# Patient Record
Sex: Male | Born: 1960 | Race: White | Hispanic: No | Marital: Married | Smoking: Never smoker
Health system: Southern US, Community
[De-identification: ages and names within clinical notes are randomized; demographics above are authoritative.]

## PROBLEM LIST (undated history)

## (undated) DIAGNOSIS — F32A Depression, unspecified: Secondary | ICD-10-CM

## (undated) DIAGNOSIS — I4891 Unspecified atrial fibrillation: Secondary | ICD-10-CM

## (undated) DIAGNOSIS — N2889 Other specified disorders of kidney and ureter: Secondary | ICD-10-CM

## (undated) DIAGNOSIS — R569 Unspecified convulsions: Secondary | ICD-10-CM

## (undated) DIAGNOSIS — F329 Major depressive disorder, single episode, unspecified: Secondary | ICD-10-CM

## (undated) DIAGNOSIS — G4730 Sleep apnea, unspecified: Secondary | ICD-10-CM

## (undated) DIAGNOSIS — F419 Anxiety disorder, unspecified: Secondary | ICD-10-CM

## (undated) DIAGNOSIS — Z87898 Personal history of other specified conditions: Secondary | ICD-10-CM

## (undated) DIAGNOSIS — I10 Essential (primary) hypertension: Secondary | ICD-10-CM

## (undated) DIAGNOSIS — C787 Secondary malignant neoplasm of liver and intrahepatic bile duct: Secondary | ICD-10-CM

## (undated) DIAGNOSIS — C801 Malignant (primary) neoplasm, unspecified: Secondary | ICD-10-CM

## (undated) HISTORY — DX: Unspecified atrial fibrillation: I48.91

## (undated) HISTORY — DX: Unspecified convulsions: R56.9

## (undated) HISTORY — PX: BRAIN SURGERY: SHX531

## (undated) HISTORY — DX: Secondary malignant neoplasm of liver and intrahepatic bile duct: C78.7

## (undated) HISTORY — DX: Anxiety disorder, unspecified: F41.9

## (undated) HISTORY — DX: Depression, unspecified: F32.A

## (undated) HISTORY — PX: APPENDECTOMY: SHX54

## (undated) HISTORY — PX: LIVER SURGERY: SHX698

## (undated) HISTORY — DX: Other specified disorders of kidney and ureter: N28.89

## (undated) HISTORY — PX: ABLATION: SHX5711

## (undated) HISTORY — DX: Personal history of other specified conditions: Z87.898

## (undated) HISTORY — DX: Major depressive disorder, single episode, unspecified: F32.9

## (undated) HISTORY — DX: Sleep apnea, unspecified: G47.30

---

## 2009-04-01 ENCOUNTER — Ambulatory Visit (HOSPITAL_COMMUNITY): Admission: RE | Admit: 2009-04-01 | Discharge: 2009-04-02 | Payer: Self-pay | Admitting: Neurosurgery

## 2010-08-16 LAB — PROTIME-INR: INR: 0.99 (ref 0.00–1.49)

## 2010-08-16 LAB — DIFFERENTIAL
Lymphs Abs: 1.8 10*3/uL (ref 0.7–4.0)
Monocytes Relative: 7 % (ref 3–12)
Neutro Abs: 4.3 10*3/uL (ref 1.7–7.7)
Neutrophils Relative %: 63 % (ref 43–77)

## 2010-08-16 LAB — COMPREHENSIVE METABOLIC PANEL
ALT: 29 U/L (ref 0–53)
BUN: 11 mg/dL (ref 6–23)
Calcium: 9.4 mg/dL (ref 8.4–10.5)
Glucose, Bld: 85 mg/dL (ref 70–99)
Sodium: 136 mEq/L (ref 135–145)
Total Protein: 7 g/dL (ref 6.0–8.3)

## 2010-08-16 LAB — CBC
Hemoglobin: 14.1 g/dL (ref 13.0–17.0)
MCHC: 34.2 g/dL (ref 30.0–36.0)
Platelets: 227 10*3/uL (ref 150–400)
RDW: 12.6 % (ref 11.5–15.5)

## 2010-08-16 LAB — APTT: aPTT: 38 seconds — ABNORMAL HIGH (ref 24–37)

## 2010-08-16 LAB — URINALYSIS, ROUTINE W REFLEX MICROSCOPIC
Nitrite: NEGATIVE
Specific Gravity, Urine: 1.019 (ref 1.005–1.030)
Urobilinogen, UA: 0.2 mg/dL (ref 0.0–1.0)

## 2013-05-28 ENCOUNTER — Ambulatory Visit: Payer: Self-pay | Admitting: Unknown Physician Specialty

## 2013-06-02 LAB — PATHOLOGY REPORT

## 2016-02-13 ENCOUNTER — Emergency Department: Payer: Managed Care, Other (non HMO)

## 2016-02-13 ENCOUNTER — Encounter: Payer: Self-pay | Admitting: *Deleted

## 2016-02-13 ENCOUNTER — Emergency Department
Admission: EM | Admit: 2016-02-13 | Discharge: 2016-02-13 | Disposition: A | Discharge status: Home / Self Care | Payer: Managed Care, Other (non HMO) | Attending: Emergency Medicine | Admitting: Emergency Medicine

## 2016-02-13 DIAGNOSIS — Z79899 Other long term (current) drug therapy: Secondary | ICD-10-CM | POA: Insufficient documentation

## 2016-02-13 DIAGNOSIS — I10 Essential (primary) hypertension: Secondary | ICD-10-CM | POA: Insufficient documentation

## 2016-02-13 DIAGNOSIS — C787 Secondary malignant neoplasm of liver and intrahepatic bile duct: Secondary | ICD-10-CM | POA: Insufficient documentation

## 2016-02-13 DIAGNOSIS — R1011 Right upper quadrant pain: Secondary | ICD-10-CM | POA: Diagnosis present

## 2016-02-13 DIAGNOSIS — Z7982 Long term (current) use of aspirin: Secondary | ICD-10-CM | POA: Insufficient documentation

## 2016-02-13 HISTORY — DX: Essential (primary) hypertension: I10

## 2016-02-13 HISTORY — DX: Malignant (primary) neoplasm, unspecified: C80.1

## 2016-02-13 LAB — TROPONIN I: TROPONIN I: 0.03 ng/mL — AB (ref <0.03)

## 2016-02-13 LAB — BASIC METABOLIC PANEL
Anion gap: 8 (ref 5–15)
BUN: 17 mg/dL (ref 6–20)
CALCIUM: 9.1 mg/dL (ref 8.9–10.3)
CO2: 30 mmol/L (ref 22–32)
CREATININE: 1.29 mg/dL — AB (ref 0.61–1.24)
Chloride: 97 mmol/L — ABNORMAL LOW (ref 101–111)
GFR calc Af Amer: >60 mL/min (ref >60)
GFR, EST NON AFRICAN AMERICAN: 60 mL/min — AB (ref >60)
GLUCOSE: 120 mg/dL — AB (ref 65–99)
Potassium: 4.8 mmol/L (ref 3.5–5.1)
Sodium: 135 mmol/L (ref 135–145)

## 2016-02-13 LAB — HEPATIC FUNCTION PANEL
ALT: 21 U/L (ref 17–63)
AST: 36 U/L (ref 15–41)
Albumin: 3.5 g/dL (ref 3.5–5.0)
Alkaline Phosphatase: 128 U/L — ABNORMAL HIGH (ref 38–126)
BILIRUBIN DIRECT: 0.1 mg/dL (ref 0.1–0.5)
BILIRUBIN INDIRECT: 0.5 mg/dL (ref 0.3–0.9)
Total Bilirubin: 0.6 mg/dL (ref 0.3–1.2)
Total Protein: 8.1 g/dL (ref 6.5–8.1)

## 2016-02-13 LAB — CBC
HCT: 40.9 % (ref 40.0–52.0)
Hemoglobin: 13.4 g/dL (ref 13.0–18.0)
MCH: 27.8 pg (ref 26.0–34.0)
MCHC: 32.8 g/dL (ref 32.0–36.0)
MCV: 84.5 fL (ref 80.0–100.0)
PLATELETS: 346 10*3/uL (ref 150–440)
RBC: 4.84 MIL/uL (ref 4.40–5.90)
RDW: 13.5 % (ref 11.5–14.5)
WBC: 16.9 10*3/uL — ABNORMAL HIGH (ref 3.8–10.6)

## 2016-02-13 LAB — LIPASE, BLOOD: LIPASE: 15 U/L (ref 11–51)

## 2016-02-13 MED ORDER — IOPAMIDOL (ISOVUE-300) INJECTION 61%
100.0000 mL | Freq: Once | INTRAVENOUS | Status: AC | PRN
  Administered 2016-02-13: 100 mL via INTRAVENOUS

## 2016-02-13 MED ORDER — SODIUM CHLORIDE 0.9 % IV BOLUS (SEPSIS)
1000.0000 mL | Freq: Once | INTRAVENOUS | Status: AC
  Administered 2016-02-13: 1000 mL via INTRAVENOUS

## 2016-02-13 MED ORDER — MORPHINE SULFATE (PF) 4 MG/ML IV SOLN
4.0000 mg | Freq: Once | INTRAVENOUS | Status: AC
  Administered 2016-02-13: 4 mg via INTRAVENOUS
  Filled 2016-02-13: qty 1

## 2016-02-13 MED ORDER — ONDANSETRON HCL 4 MG/2ML IJ SOLN
4.0000 mg | Freq: Once | INTRAMUSCULAR | Status: AC
  Administered 2016-02-13: 4 mg via INTRAVENOUS
  Filled 2016-02-13: qty 2

## 2016-02-13 MED ORDER — IOPAMIDOL (ISOVUE-300) INJECTION 61%
30.0000 mL | Freq: Once | INTRAVENOUS | Status: AC | PRN
  Administered 2016-02-13: 30 mL via ORAL

## 2016-02-13 NOTE — ED Notes (Signed)
Pt.wanting to eat and drink. Okayed per MD.

## 2016-02-13 NOTE — ED Provider Notes (Signed)
[facility] Emergency Department Provider Note   ____________________________________________   First MD Initiated Contact with Patient 02/13/16 1948     (approximate)  I have reviewed the triage vital signs and the nursing notes.   HISTORY  Chief Complaint Abdominal Pain and Chest Pain   HPI Kevin Chambers is a 55 y.o. male with a history of metastatic melanoma who is presenting to the emergency department today with 3 days of sharp right upper quadrant abdominal pain which he says is radiating upward into his lower chest. He denies any shortness of breath but says that the pain does worsen with deep breathing. He says that he has had this pain over the past few months ever since having his mass resection of his liver for metastatic melanoma. However, he says that it usually goes away and over the past 3 days it has stayed. He says that sometimes the pain gets so bad that he gets nauseous but he has not any vomiting or diarrhea. Denies any radiation of the pain. Says that he has had chemotherapy and radiation in the past but has not had a dose since 3 years ago.   No past medical history on file.  There are no active problems to display for this patient.   No past surgical history on file.  Prior to Admission medications   Medication Sig Start Date End Date Taking? Authorizing Provider  amphetamine-dextroamphetamine (ADDERALL) 30 MG tablet Take 1 tablet by mouth 2 (two) times daily. 01/28/16  Yes Historical Provider, MD  aspirin EC 81 MG tablet Take 1 tablet by mouth daily.   Yes Historical Provider, MD  atorvastatin (LIPITOR) 40 MG tablet Take 1 tablet by mouth daily. 02/09/16  Yes Historical Provider, MD  Multiple Vitamin (MULTI-VITAMINS) TABS Take 1 tablet by mouth daily.   Yes Historical Provider, MD  nebivolol (BYSTOLIC) 10 MG tablet Take 1 tablet by mouth daily. 09/12/15  Yes Historical Provider, MD  tamsulosin (FLOMAX) 0.4 MG CAPS capsule Take 1  capsule by mouth daily. 12/08/15  Yes Historical Provider, MD    Allergies Review of patient's allergies indicates no known allergies.  No family history on file.  Social History Social History  Substance Use Topics  . Smoking status: Never Smoker  . Smokeless tobacco: Never Used  . Alcohol use No    Review of Systems Constitutional: No fever/chills Eyes: No visual changes. ENT: No sore throat. Cardiovascular: Denies chest pain. Respiratory: Denies shortness of breath. Gastrointestinal:  no vomiting.  No diarrhea.  No constipation. Genitourinary: Negative for dysuria. Musculoskeletal: Negative for back pain. Skin: Negative for rash. Neurological: Negative for headaches, focal weakness or numbness.  10-point ROS otherwise negative.  ____________________________________________   PHYSICAL EXAM:  VITAL SIGNS: ED Triage Vitals [02/13/16 1719]  Enc Vitals Group     BP 116/68     Pulse      Resp 18     Temp 98.2 F (36.8 C)     Temp Source Oral     SpO2 98 %     Weight 278 lb (126.1 kg)     Height 6\' 6"  (1.981 m)     Head Circumference      Peak Flow      Pain Score 9     Pain Loc      Pain Edu?      Excl. in [place]?     Constitutional: Alert and oriented. Well appearing and in no acute distress. Eyes: Conjunctivae are normal.  PERRL. EOMI. Head: Atraumatic. Nose: No congestion/rhinnorhea. Mouth/Throat: Mucous membranes are moist.   Neck: No stridor.   Cardiovascular: Normal rate, regular rhythm. Grossly normal heart sounds.  Good peripheral circulation. Respiratory: Normal respiratory effort.  No retractions. Lungs CTAB. Gastrointestinal: Soft With tenderness to the right upper quadrant with a negative Murphy sign. No distention.  No CVA tenderness. Musculoskeletal: No lower extremity tenderness nor edema.  No joint effusions. Neurologic:  Normal speech and language. No gross focal neurologic deficits are appreciated.  Skin:  Skin is warm, dry and intact. No  rash noted. Psychiatric: Mood and affect are normal. Speech and behavior are normal.  ____________________________________________   LABS (all labs ordered are listed, but only abnormal results are displayed)  Labs Reviewed  BASIC METABOLIC PANEL - Abnormal; Notable for the following:       Result Value   Chloride 97 (*)    Glucose, Bld 120 (*)    Creatinine, Ser 1.29 (*)    GFR calc non Af Amer 60 (*)    All other components within normal limits  CBC - Abnormal; Notable for the following:    WBC 16.9 (*)    All other components within normal limits  TROPONIN I - Abnormal; Notable for the following:    Troponin I 0.03 (*)    All other components within normal limits  HEPATIC FUNCTION PANEL  LIPASE, BLOOD  TROPONIN I   ____________________________________________  EKG  ED ECG REPORT I, Doran Stabler, the attending physician, personally viewed and interpreted this ECG.   Date: 02/13/2016  EKG Time: 1717  Rate: 75  Rhythm: normal sinus rhythm  Axis: Normal  Intervals:none  ST&T Change: No ST segment elevation or depression. No abnormal T-wave inversion.  ____________________________________________  RADIOLOGY  CT Abdomen Pelvis W Contrast (Accession [id number]) (Order [id number])  Imaging  Date: 02/13/2016 Department: [facility] EMERGENCY DEPARTMENT Released By/Authorizing: Orbie Pyo, MD (auto-released)  Exam Information   Status Exam Begun  Exam Ended   Final [99] 02/13/2016 8:51 PM 02/13/2016 9:06 PM  PACS Images   Show images for CT Abdomen Pelvis W Contrast  Study Result   CLINICAL DATA:  Right upper quadrant abdominal pain radiating to the right side of chest for 3 days. Liver resection 3 months ago at [facility]. Nausea. Shortness of breath. History of metastatic melanoma.  EXAM: CT ABDOMEN AND PELVIS WITH CONTRAST  TECHNIQUE: Multidetector CT imaging of the abdomen and pelvis was performed using the standard  protocol following bolus administration of intravenous contrast.  CONTRAST:  151mL ISOVUE-300 IOPAMIDOL (ISOVUE-300) INJECTION 61%  COMPARISON:  None.  FINDINGS: Lower chest: Linear atelectasis in the lung bases. Coronary artery calcifications.  Hepatobiliary: Multiple low-attenuation lesions demonstrated throughout the liver with large coalescing lesions particularly in segments 5 and segment 6 although other segments are fall. Appearance is consistent with metastatic disease. Surgical clips in the inferior liver edge segment 5. This is consistent with postoperative change. No evidence of hematoma or fluid collection. Gallbladder and bile ducts are unremarkable.  Pancreas: Unremarkable. No pancreatic ductal dilatation or surrounding inflammatory changes.  Spleen: Normal in size without focal abnormality.  Adrenals/Urinary Tract: Right adrenal gland nodule measuring 2 cm diameter, likely metastatic. Kidneys are symmetrical without evidence of mass or hydronephrosis. No bladder wall thickening.  Stomach/Bowel: Stomach, small bowel, and colon are not abnormally distended. Colon is diffusely stool-filled with scattered diverticula. No inflammatory changes are identified. Appendix is normal.  Vascular/Lymphatic: Extensive metastatic lymphadenopathy demonstrated in the porta  hepatis, celiac axis, retroperitoneum, and paracaval regions. Lymph nodes measure up to 4.2 x 6.9 cm. Lymph nodes displace the inferior vena cava. The IVC appears patent without significant narrowing.  Reproductive: Prostate gland is enlarged, measuring 4.7 cm diameter. Prostate calcifications are present. Small amount of free fluid in the pelvis is nonspecific but likely to be reactive.  Other: Abdominal wall musculature appears intact. No free air in the abdomen  Musculoskeletal: Degenerative changes throughout the lumbar spine. No destructive bone lesions  appreciated.  IMPRESSION: Diffuse hepatic metastases. Extensive lymph node metastases demonstrated in the porta hepatis, right upper quadrant, retroperitoneal, and paracaval regions. 2 cm diameter right adrenal gland nodule is likely metastatic. No evidence of bowel obstruction.   Electronically Signed   By: Lucienne Capers M.D.   On: 02/13/2016 21:48    ____________________________________________   PROCEDURES  Procedure(s) performed:   Procedures  Critical Care performed:   ____________________________________________   INITIAL IMPRESSION / ASSESSMENT AND PLAN / ED COURSE  Pertinent labs & imaging results that were available during my care of the patient were reviewed by me and considered in my medical decision making (see chart for details).  ----------------------------------------- 11 PM on 02/13/2016 ----------------------------------------- Discussed with patient his imaging results.  He understands the likely recurrence of his metastatic cancer.  He has a follow up appointment tomorrow with his liver surgeon, Dr. Manuella Ghazi, at [facility].  He also has pain medication at home. Second troponin was undetectable. Unlikely to be PE, no shortness of breath. Normal EKG. Most likely be mass effect from his recurrent metastases.   Clinical Course     ____________________________________________   FINAL CLINICAL IMPRESSION(S) / ED DIAGNOSES  Recurrent liver metastases.    NEW MEDICATIONS STARTED DURING THIS VISIT:  New Prescriptions   No medications on file     Note:  This document was prepared using Dragon voice recognition software and may include unintentional dictation errors.    Orbie Pyo, MD 02/13/16 [phone]

## 2016-02-13 NOTE — ED Triage Notes (Signed)
Pt has ruq abd pain radiating into right side of chest for 3 days.  Pt had liver resection 3 months ago at [facility].   Pt has nausea.  No v/d.  Pt reports intermittent sob.  Pt alert.

## 2016-02-13 NOTE — ED Notes (Signed)
MD at bedside. 

## 2016-02-13 NOTE — ED Notes (Signed)
Lab called with troponin 0.03.   First nurse stephanie rn aware

## 2016-08-27 ENCOUNTER — Emergency Department
Admission: EM | Admit: 2016-08-27 | Discharge: 2016-08-27 | Disposition: A | Discharge status: Home / Self Care | Payer: Managed Care, Other (non HMO) | Attending: Emergency Medicine | Admitting: Emergency Medicine

## 2016-08-27 ENCOUNTER — Encounter: Payer: Self-pay | Admitting: Emergency Medicine

## 2016-08-27 ENCOUNTER — Emergency Department: Payer: Managed Care, Other (non HMO)

## 2016-08-27 DIAGNOSIS — G936 Cerebral edema: Secondary | ICD-10-CM

## 2016-08-27 DIAGNOSIS — R42 Dizziness and giddiness: Secondary | ICD-10-CM

## 2016-08-27 DIAGNOSIS — Z7982 Long term (current) use of aspirin: Secondary | ICD-10-CM | POA: Diagnosis not present

## 2016-08-27 DIAGNOSIS — I10 Essential (primary) hypertension: Secondary | ICD-10-CM | POA: Diagnosis not present

## 2016-08-27 LAB — COMPREHENSIVE METABOLIC PANEL
ALBUMIN: 4.1 g/dL (ref 3.5–5.0)
ALT: 25 U/L (ref 17–63)
AST: 23 U/L (ref 15–41)
Alkaline Phosphatase: 98 U/L (ref 38–126)
Anion gap: 6 (ref 5–15)
BUN: 17 mg/dL (ref 6–20)
CHLORIDE: 101 mmol/L (ref 101–111)
CO2: 28 mmol/L (ref 22–32)
CREATININE: 0.87 mg/dL (ref 0.61–1.24)
Calcium: 9.4 mg/dL (ref 8.9–10.3)
GFR calc Af Amer: >60 mL/min (ref >60)
GLUCOSE: 127 mg/dL — AB (ref 65–99)
POTASSIUM: 4.5 mmol/L (ref 3.5–5.1)
SODIUM: 135 mmol/L (ref 135–145)
Total Bilirubin: 0.5 mg/dL (ref 0.3–1.2)
Total Protein: 8 g/dL (ref 6.5–8.1)

## 2016-08-27 LAB — CBC
HCT: 42.8 % (ref 40.0–52.0)
Hemoglobin: 14.3 g/dL (ref 13.0–18.0)
MCH: 28.3 pg (ref 26.0–34.0)
MCHC: 33.4 g/dL (ref 32.0–36.0)
MCV: 84.8 fL (ref 80.0–100.0)
PLATELETS: 283 10*3/uL (ref 150–440)
RBC: 5.05 MIL/uL (ref 4.40–5.90)
RDW: 16.2 % — AB (ref 11.5–14.5)
WBC: 9 10*3/uL (ref 3.8–10.6)

## 2016-08-27 MED ORDER — DEXAMETHASONE 4 MG PO TABS: 4.0000 mg | ORAL_TABLET | Freq: Three times a day (TID) | ORAL | 0 refills | Status: AC

## 2016-08-27 MED ORDER — LEVETIRACETAM 500 MG PO TABS: 500.0000 mg | ORAL_TABLET | Freq: Two times a day (BID) | ORAL | 0 refills | Status: DC

## 2016-08-27 MED ORDER — DEXAMETHASONE 4 MG PO TABS
4.0000 mg | ORAL_TABLET | Freq: Once | ORAL | Status: AC
  Administered 2016-08-27: 4 mg via ORAL
  Filled 2016-08-27 (×2): qty 1

## 2016-08-27 MED ORDER — LEVETIRACETAM 500 MG PO TABS
500.0000 mg | ORAL_TABLET | Freq: Once | ORAL | Status: AC
  Administered 2016-08-27: 500 mg via ORAL
  Filled 2016-08-27: qty 1

## 2016-08-27 MED ORDER — DEXAMETHASONE 4 MG PO TABS: 0.5000 mg | ORAL_TABLET | Freq: Three times a day (TID) | ORAL | 0 refills | Status: DC

## 2016-08-27 NOTE — ED Notes (Signed)
ORTHOSTATIC VITALS Laying BP 147/85 Pulse 52 Sitting BP 140/90 Pulse 59 Standing BP 127/88 Pulse 64

## 2016-08-27 NOTE — ED Provider Notes (Signed)
[facility] Emergency Department Provider Note ____________________________________________   I have reviewed the triage vital signs and the triage nursing note.  HISTORY  Chief Complaint Dizziness   Historian Patient and wife  HPI Kevin Chambers is a 56 y.o. male with a history of recurrent and metastatic melanoma with recently diagnosed brain metastases. He is being followed by oncology at [facility]. He had radiation to his brain about a week ago.  Last night he felt a little bit dizzy and went back out and sat down and for few minutes noticed his left arm was shaking. He was alert and conscious during this. It ultimately stopped, sounds like less than 5 minutes or so. He drove himself home. No focal weakness or numbness at the time nor after. No ongoing significant headache or dizziness at present.  No sinus congestion or allergies. No chest pain or palpitations. No trouble breathing shortness breath. No fevers. No abdominal pain. No recent nausea vomiting or diarrhea. No urinary symptoms.    Past Medical History:  Diagnosis Date  . Cancer ([place])   . Hypertension     There are no active problems to display for this patient.   History reviewed. No pertinent surgical history.  Prior to Admission medications   Medication Sig Start Date End Date Taking? Authorizing Provider  amphetamine-dextroamphetamine (ADDERALL) 30 MG tablet Take 1 tablet by mouth 2 (two) times daily. 01/28/16   Historical Provider, MD  aspirin EC 81 MG tablet Take 1 tablet by mouth daily.    Historical Provider, MD  atorvastatin (LIPITOR) 40 MG tablet Take 1 tablet by mouth daily. 02/09/16   Historical Provider, MD  dexamethasone (DECADRON) 4 MG tablet Take 1 tablet (4 mg total) by mouth 3 (three) times daily. 08/27/16 08/27/17  Lisa Roca, MD  levETIRAcetam (KEPPRA) 500 MG tablet Take 1 tablet (500 mg total) by mouth 2 (two) times daily. 08/27/16   Lisa Roca, MD  Multiple Vitamin  (MULTI-VITAMINS) TABS Take 1 tablet by mouth daily.    Historical Provider, MD  nebivolol (BYSTOLIC) 10 MG tablet Take 1 tablet by mouth daily. 09/12/15   Historical Provider, MD  tamsulosin (FLOMAX) 0.4 MG CAPS capsule Take 1 capsule by mouth daily. 12/08/15   Historical Provider, MD    No Known Allergies  No family history on file.  Social History Social History  Substance Use Topics  . Smoking status: Never Smoker  . Smokeless tobacco: Never Used  . Alcohol use No    Review of Systems  Constitutional: Negative for fever. Eyes: Negative for visual changes. ENT: Negative for sore throat. Cardiovascular: Negative for chest pain. Respiratory: Negative for shortness of breath. Gastrointestinal: Negative for abdominal pain, vomiting and diarrhea. Genitourinary: Negative for dysuria. Musculoskeletal: Negative for back pain. Skin: Negative for rash. Neurological: Negative for headache. 10 point Review of Systems otherwise negative ____________________________________________   PHYSICAL EXAM:  VITAL SIGNS: ED Triage Vitals  Enc Vitals Group     BP 08/27/16 1322 (!) 170/91     Pulse Rate 08/27/16 1322 65     Resp 08/27/16 1322 18     Temp 08/27/16 1322 98.2 F (36.8 C)     Temp src --      SpO2 08/27/16 1322 98 %     Weight 08/27/16 1322 263 lb (119.3 kg)     Height 08/27/16 1322 6\' 6"  (1.981 m)     Head Circumference --      Peak Flow --  Pain Score 08/27/16 1321 0     Pain Loc --      Pain Edu? --      Excl. in [place]? --      Constitutional: Alert and oriented. Well appearing and in no distress. HEENT   Head: Normocephalic and atraumatic.      Eyes: Conjunctivae are normal. PERRL. Normal extraocular movements.      Ears:         Nose: No congestion/rhinnorhea.   Mouth/Throat: Mucous membranes are moist.   Neck: No stridor. Cardiovascular/Chest: Normal rate, regular rhythm.  No murmurs, rubs, or gallops. Respiratory: Normal respiratory effort without  tachypnea nor retractions. Breath sounds are clear and equal bilaterally. No wheezes/rales/rhonchi. Gastrointestinal: Soft. No distention, no guarding, no rebound. Nontender.    Genitourinary/rectal:Deferred Musculoskeletal: Nontender with normal range of motion in all extremities. No joint effusions.  No lower extremity tenderness.  No edema. Neurologic:  Normal speech and language. No gross or focal neurologic deficits are appreciated. Skin:  Skin is warm, dry and intact. No rash noted. Psychiatric: Mood and affect are normal. Speech and behavior are normal. Patient exhibits appropriate insight and judgment.   ____________________________________________  LABS (pertinent positives/negatives)  Labs Reviewed  CBC - Abnormal; Notable for the following:       Result Value   RDW 16.2 (*)    All other components within normal limits  COMPREHENSIVE METABOLIC PANEL - Abnormal; Notable for the following:    Glucose, Bld 127 (*)    All other components within normal limits    ____________________________________________    EKG I, Lisa Roca, MD, the attending physician have personally viewed and interpreted all ECGs.  56 bpm. Normal sinus rhythm. Narrow QRS normal axis. Occasional PVC. Normal ST and T-wave ____________________________________________  RADIOLOGY All Xrays were viewed by me. Imaging interpreted by Radiologist.  CT without contrast:  IMPRESSION: 1. Right paramedian posterior frontal lobe mass measuring up to 28 mm with surrounding region of hypoattenuation and local mass effect that may represent associated vasogenic edema or infiltrating neoplasm. The mass abuts the falx and it is uncertain whether is intra-axial or extra-axial. This can be further characterized with MRI of the brain with and without intravenous contrast. 2. Otherwise no evidence for large territory infarct, focal mass effect, or acute intracranial  hemorrhage. __________________________________________  PROCEDURES  Procedure(s) performed: None  Critical Care performed: None  ____________________________________________   ED COURSE / ASSESSMENT AND PLAN  Pertinent labs & imaging results that were available during my care of the patient were reviewed by me and considered in my medical decision making (see chart for details).   Patient is here with complaint of dizziness last night with an episode of left arm shaking.  He is currently back to baseline neurologic status. He does have a history of brain metastases from melanoma with recent starting on hold brain radiation. His head CT today does not show bleeding or stroke, however there is the mass and the vasogenic edema. I spoke with the neurologist on call and is recommended patient be started on Decadron as well as Keppra for seizure prophylaxis. The location of his seizure and left arm could be clinically associated to the right frontal lobe mass.  In terms of other reasons for dizziness, patient is not showing any signs of infection, cardiac or pulmonary emergency condition.  He was slightly orthostatic here, although no hypotension or tachycardia, his heart rate did go up by 10 and his blood pressure dropped by 10 as well.  We discussed when taking adequate hydration.  I did attempt to get in touch with patient's medical oncologist from [place] Dr. Raynelle Chary.  I was routed to the [place] call center, and then the medical oncology office, and then a triage nurse line, and was not able to speak with the person. I have my secretary attempted to get in touch with Dr. Raynelle Chary. In any case I was able to discuss with the neurologist, and I think he is okay for discharge home today.  I spoke with Dr. Raynelle Chary at about 3:45, we discussed patient is neurologically stable right now, no additional symptoms. He would like to go home and we think this is reasonable. Dr. Raynelle Chary will see the patient clinic  this week. With dexamethasone and Keppra.   CONSULTATIONS:  Dr. Doy Mince, neurology by phone. We discussed this case and she recommends Decadron 4 mg every 8 hours by mouth. Also Keppra 500 mg twice daily, and follow-up with the medical oncologist for any adjustments.  Dr. Raynelle Chary, [facility] oncology by phone.   Patient / Family / Caregiver informed of clinical course, medical decision-making process, and agree with plan.   I discussed return precautions, follow-up instructions, and discharge instructions with patient and/or family.  Discharge instructions:  You were evaluated after episode of dizziness and arm shaking, and had a CT scan today that showed some swelling which is also called edema, likely from the recent radiation treatment to the brain lesions.  After discussion with the neurologist, your recommended to start on anti-inflammatory Decadron pills 3 times daily to help with the swelling. You are also recommended to start on antiseizure medication called Keppra.  Return to the emergency room immediately for any worsening symptoms including any change in mental status, seizure, weakness, numbness, worsening dizziness, headache, vomiting, or any other symptoms concerning to you.  ___________________________________________   FINAL CLINICAL IMPRESSION(S) / ED DIAGNOSES   Final diagnoses:  Dizziness  Brain edema [facility])              Note: This dictation was prepared with Dragon dictation. Any transcriptional errors that result from this process are unintentional    Lisa Roca, MD 08/27/16 [phone]

## 2016-08-27 NOTE — Discharge Instructions (Signed)
You were evaluated after episode of dizziness and arm shaking, and had a CT scan today that showed some swelling which is also called edema, likely from the recent radiation treatment to the brain lesions.  After discussion with the neurologist, your recommended to start on anti-inflammatory Decadron pills 3 times daily to help with the swelling. You are also recommended to start on antiseizure medication called Keppra.  Return to the emergency room immediately for any worsening symptoms including any change in mental status, seizure, weakness, numbness, worsening dizziness, headache, vomiting, or any other symptoms concerning to you.

## 2016-08-27 NOTE — ED Notes (Signed)
Pharmacy called to send decadron

## 2016-08-27 NOTE — ED Notes (Signed)
Pt reports that he started brain radiation last Wed and since then he had an episode last night and then again today of dizziness and feeling like he was going to pass out - pt reports that if he lays down the dizziness goes away - denies any headache or N/V - denies any pain

## 2016-08-27 NOTE — ED Triage Notes (Signed)
History of brain cancer currently getting immunotherapy and radiation. States began dizziness last night.

## 2017-05-20 ENCOUNTER — Other Ambulatory Visit
Admission: RE | Admit: 2017-05-20 | Discharge: 2017-05-20 | Disposition: A | Discharge status: Home / Self Care | Payer: Managed Care, Other (non HMO) | Source: Ambulatory Visit | Attending: Internal Medicine | Admitting: Internal Medicine

## 2017-05-20 DIAGNOSIS — R197 Diarrhea, unspecified: Secondary | ICD-10-CM | POA: Insufficient documentation

## 2017-05-20 LAB — C DIFFICILE QUICK SCREEN W PCR REFLEX
C Diff antigen: NEGATIVE
C Diff interpretation: NOT DETECTED
C Diff toxin: NEGATIVE

## 2017-05-28 ENCOUNTER — Ambulatory Visit: Payer: Managed Care, Other (non HMO) | Admitting: Cardiovascular Disease

## 2017-06-03 ENCOUNTER — Encounter: Payer: Self-pay | Admitting: Cardiovascular Disease

## 2017-06-03 ENCOUNTER — Ambulatory Visit: Payer: Managed Care, Other (non HMO) | Admitting: Cardiovascular Disease

## 2017-06-03 VITALS — BP 154/97 | HR 77 | Ht 78.0 in | Wt 243.2 lb

## 2017-06-03 DIAGNOSIS — I2510 Atherosclerotic heart disease of native coronary artery without angina pectoris: Secondary | ICD-10-CM | POA: Diagnosis not present

## 2017-06-03 DIAGNOSIS — I10 Essential (primary) hypertension: Secondary | ICD-10-CM

## 2017-06-03 DIAGNOSIS — Z7689 Persons encountering health services in other specified circumstances: Secondary | ICD-10-CM | POA: Diagnosis not present

## 2017-06-03 MED ORDER — AMLODIPINE BESYLATE 5 MG PO TABS: 5.0000 mg | ORAL_TABLET | Freq: Every day | ORAL | 1 refills | Status: DC

## 2017-06-03 NOTE — Progress Notes (Signed)
Cardiology Office Note   Date:  06/03/2017   ID:  Kevin Chambers, DOB July 26, 1960, MRN [id number]  PCP:  Kirk Ruths, MD  Cardiologist:   Kathlyn Sacramento, MD   Chief Complaint  Patient presents with  . other    Self ref c/o BP problems. Meds reviewed verbally with pt.      History of Present Illness: Kevin Chambers is a 57 y.o. male who is self-referred for evaluation of asymptomatic coronary atherosclerosis noted on previous CT scan of the chest and management of hypertension. The patient has known history of metastatic melanoma.  This was initially diagnosed in the 90s and was treated.  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  He had multiple brain surgeries done recently with some complications related to infection and has received immunotherapy. Previous CT scan of the chest showed mild to moderate three-vessel coronary calcifications.  This was in November 2018.  The patient denies any previous history of ischemic heart disease.  No chest pain, shortness of breath or palpitations. The other issue is elevated blood pressure.  He reports history of hypertension for many years and was on Bystolic in the past.  He was switched to carvedilol while hospitalized at [facility].  He was given IV hydralazine as needed for elevated blood pressure but was not sent home on any other oral medication.  His blood pressure has been significantly elevated at home according to the wife frequently with systolic blood pressure readings above 200. Patient is not a smoker and has no family history of coronary artery disease.  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     Past Medical History:  Diagnosis Date  . A-fib ([place])   . Anxiety   . Cancer ([place])   . Depression   . H/O brain tumor   . Hypertension   . Liver metastasis ([place])   . Right renal mass   . Seizures ([place])   . Sleep apnea     Past Surgical History:    Procedure Laterality Date  . ABLATION    . APPENDECTOMY    . BRAIN SURGERY    . LIVER SURGERY       Current Outpatient Medications  Medication Sig Dispense Refill  . Acetaminophen (MAPAP) 500 MG coapsule Take by mouth as needed.     Marland Kitchen amphetamine-dextroamphetamine (ADDERALL) 30 MG tablet Take 1 tablet by mouth daily.     Marland Kitchen amphetamine-dextroamphetamine (ADDERALL) 5 MG tablet Take by mouth as needed.     . Artificial Saliva ([employer] DRY MOUTH) SOLN by Transmucosal route.    Marland Kitchen aspirin EC 81 MG tablet Take 1 tablet by mouth daily.    Marland Kitchen atorvastatin (LIPITOR) 40 MG tablet Take 1 tablet by mouth daily.    . baclofen (LIORESAL) 10 MG tablet Take one tablet at bedtime. Take one during the day if needed    . carvedilol (COREG) 12.5 MG tablet Take 12.5 mg by mouth 2 (two) times daily with a meal.     . dexamethasone (DECADRON) 1 MG tablet Take 4 mg (4 tablets) twice a day for one day (1/19). Then Take 3 mg (3 tablets) twice a day for one day (1/20). Then Take 2 mg (2 tablets) twice a day until follow up with Dr. Brett Albino (about 4 weeks).    Marland Kitchen dexamethasone (DECADRON) 4 MG tablet Take 1 tablet (4 mg total) by mouth  3 (three) times daily. 90 tablet 0  . dronabinol (MARINOL) 5 MG capsule Take 5 mg by mouth daily.     Marland Kitchen escitalopram (LEXAPRO) 20 MG tablet Take by mouth.    . LevETIRAcetam (KEPPRA PO) Take 1,275 mg by mouth daily.    . magnesium oxide (MAG-OX) 400 MG tablet Take by mouth.    . Melatonin 3 MG TABS Take by mouth.    . Multiple Vitamin (MULTI-VITAMINS) TABS Take 1 tablet by mouth daily.    . ondansetron (ZOFRAN-ODT) 8 MG disintegrating tablet Take by mouth.    . pantoprazole (PROTONIX) 40 MG tablet Take 40 mg by mouth daily.     . pregabalin (LYRICA) 75 MG capsule Take 75 mg by mouth 2 (two) times daily.     . tamsulosin (FLOMAX) 0.4 MG CAPS capsule Take 1 capsule by mouth daily.    Marland Kitchen amLODipine (NORVASC) 5 MG tablet Take 1 tablet (5 mg total) by mouth daily. 90 tablet 1   No current  facility-administered medications for this visit.     Allergies:   Patient has no known allergies.    Social History:  The patient  reports that  has never smoked. he has never used smokeless tobacco. He reports that he does not drink alcohol or use drugs.   Family History:  The patient's family history is negative for premature coronary artery disease or arrhythmia.   ROS:  Please see the history of present illness.   Otherwise, review of systems are positive for none.   All other systems are reviewed and negative.    PHYSICAL EXAM: VS:  BP (!) 154/97 (BP Location: Left Wrist, Patient Position: Sitting, Cuff Size: Normal)   Pulse 77   Ht 6\' 6"  (1.981 m)   Wt 243 lb 4 oz (110.3 kg)   BMI 28.11 kg/m  , BMI Body mass index is 28.11 kg/m. GEN: Well nourished, well developed, in no acute distress  HEENT: normal  Neck: no JVD, carotid bruits, or masses Cardiac: RRR; no murmurs, rubs, or gallops,no edema  Respiratory:  clear to auscultation bilaterally, normal work of breathing GI: soft, nontender, nondistended, + BS MS: no deformity or atrophy  Skin: warm and dry, no rash Neuro:  Strength and sensation are intact Psych: euthymic mood, full affect   EKG:  EKG is ordered today. The ekg ordered today demonstrates normal sinus rhythm with PVCs.   Recent Labs: 08/27/2016: ALT 25; BUN 17; Creatinine, Ser 0.87; Hemoglobin 14.3; Platelets 283; Potassium 4.5; Sodium 135    Lipid Panel No results found for: CHOL, TRIG, HDL, CHOLHDL, VLDL, LDLCALC, LDLDIRECT    Wt Readings from Last 3 Encounters:  06/03/17 243 lb 4 oz (110.3 kg)  08/27/16 263 lb (119.3 kg)  02/13/16 278 lb (126.1 kg)        PAD Screen 06/03/2017  Previous PAD dx? No  Previous surgical procedure? No  Pain with walking? (No Data)  Subsides with rest? No  Feet/toe relief with dangling? No  Painful, non-healing ulcers? No  Extremities discolored? Yes      ASSESSMENT AND PLAN:  1.  Coronary  atherosclerosis: Noted on previous CT scan of the chest.  No anginal symptoms.  EKG does not show any ischemic changes.  He is already on good medical therapy with low-dose aspirin and atorvastatin.  No need for further testing.  2.  Essential hypertension: Blood pressure continues to be elevated.  I elected to add amlodipine 5 mg once daily.  I asked  his wife to continue monitoring his blood pressure and if there is no improvement, we can consider increasing the dose of carvedilol or adding an ACE inhibitor or ARB.  3.  Metastatic melanoma: Followed at [facility].    Disposition:   FU with me in 3 months  Signed,  Kathlyn Sacramento, MD  06/03/2017 3:50 PM    [place]

## 2017-06-03 NOTE — Patient Instructions (Signed)
Medication Instructions:  Your physician has recommended you make the following change in your medication:  START taking amlodipine 5mg  once daily   Labwork: none  Testing/Procedures: none  Follow-Up: Your physician recommends that you schedule a follow-up appointment in: 3 months with Dr. Fletcher Anon.    Any Other Special Instructions Will Be Listed Below (If Applicable).     If you need a refill on your cardiac medications before your next appointment, please call your pharmacy.

## 2017-06-10 ENCOUNTER — Telehealth: Payer: Self-pay | Admitting: Cardiovascular Disease

## 2017-06-10 NOTE — Telephone Encounter (Signed)
S/w patient and he asked me to speak with his wife. Wife said patient had an episode yesterday afternoon where his BP was elevated around 4 pm - 173/110, HR 128. At that time she gave him his carvedilol 12.5 mg. Then he got up and felt dizzy and wife reports he passed out for about 10 seconds (no fall or injury).  Yesterday evening, at 5:45 pm BP 139/79, HR 103 and later on before bedtime BP 142/86, HR 101. Today, BP 144/79, HR 96. Patient denies any dizziness today, and reports no chest pain, shortness of breath or swelling. Wife reports she does not have any other readings from this past week as she has not been doing consistently since patient was discharged from the hospital.  Patient saw Dr Fletcher Anon on 06/03/17 and was started on amlodipine 5 mg daily which patient takes before bedtime. Advised wife to monitor BP/HR in the morning prior to taking morning medicine and then about 2 hours after for 5-7 days and to call us with an update. Encourage patient to move slowly when changing from sitting/laying to standing position. Also, advised if patient passes out or dizziness gets worse to call us or 911 and she verbalized understanding.

## 2017-06-10 NOTE — Telephone Encounter (Signed)
Pt spouse calling stating they are not seeing any improvement in patient We added another BP medication and patient has had episodes of Dizzy spells along with passing out spells.  Pt passed out last night but has been dizzy here and there  Right after he passed out last night his BP 173/110 HR 128  She had just given him a coreg   This morning it is 144/79 HR   She also states pt had irregular heart beat , states his 6 beats stop and then 2 beats on heart   Was advised to call back   Please advise

## 2017-06-13 NOTE — Telephone Encounter (Signed)
I suggest continued monitoring of blood pressure and heart rate over the next few days and call us next week with readings to see if further adjustment is needed.  He should be cautious with sudden change in position.

## 2017-06-14 ENCOUNTER — Telehealth: Payer: Self-pay | Admitting: Cardiovascular Disease

## 2017-06-14 ENCOUNTER — Other Ambulatory Visit: Payer: Self-pay

## 2017-06-14 MED ORDER — AMLODIPINE BESYLATE 10 MG PO TABS: 10.0000 mg | ORAL_TABLET | Freq: Every day | ORAL | 1 refills | Status: AC

## 2017-06-14 NOTE — Telephone Encounter (Signed)
I spoke with patient's wife, Steffanie Dunn. She reports HR upper 70s to low 90s.   Per Dr. Tyrell Antonio notes: "I asked his wife to continue monitoring his blood pressure and if there is no improvement, we can consider increasing the dose of carvedilol or adding an ACE inhibitor or ARB."  I reviewed with Dr. Rockey Situ who recommends increasing amlodipine to 10mg  once daily. She should monitor pt's VS x 1 week and call if not improved. May need to increase coreg to 25mg  BID. Reviewed with wife. Medication list updated. Routed to Dr. Fletcher Anon to make aware.

## 2017-06-14 NOTE — Telephone Encounter (Signed)
Patient wife calling with BP readings requested She states the blood pressure always seems fine in the morning then Before COREG at 5p  Day 1 - 158/105  Day 2 - 171/108 Day 3 - 146/84  Before amLODipine 8pm Day 1 - 172/95 Day 2 - 162/101 Day 3 - 176/116  Please call to discuss

## 2017-06-18 ENCOUNTER — Ambulatory Visit: Payer: Managed Care, Other (non HMO) | Admitting: Cardiovascular Disease

## 2017-06-18 ENCOUNTER — Telehealth: Payer: Self-pay | Admitting: Cardiovascular Disease

## 2017-06-18 NOTE — Telephone Encounter (Signed)
Called patient. He asked me to call his wife even though there is not DPR.  I encouraged him to fill out next time in office. He verbalized understanding.   Called patient's wife. She states patient is still experiencing dizziness upon standing. He will stand up and have to sit down for a few minutes before standing back up to walk with his walker. Today, BP was 168/86 and about 30 minutes later when patient went to stand up to walk, he got dizzy and she checked BP and it was around 126/80's.  She thinks his heart rate is in the 68 range. She reports she does not remember patient being dizzy in the hospital but did note dizziness prior to appointment with Dr Fletcher Anon. Per wife, she feels dizziness has worsened since starting amlodipine.  She is also concerned that carvedilol is not agreeing with patient.  Patient was on Bystolic for a while prior to hospitalization and seemed to tolerate that better. Patient has next appointment with Neurologist on 06/27/17.  Advised her to continue current meds at this time and have patient continue to change positions slowly. Routing to Dr Fletcher Anon for advice.

## 2017-06-18 NOTE — Telephone Encounter (Signed)
We can switch carvedilol to Bystolic 10 mg daily and see if symptoms improve.

## 2017-06-18 NOTE — Telephone Encounter (Signed)
Pt spouse Steffanie Dunn calling stating now patient is having dizzy spells Noticed it when we upped the dose  He stand up and has this issues Not sure if it is the medication and or if it is something else.  Please advise.

## 2017-06-19 MED ORDER — NEBIVOLOL HCL 10 MG PO TABS: 10.0000 mg | ORAL_TABLET | Freq: Every day | ORAL | 3 refills | Status: AC

## 2017-06-19 NOTE — Telephone Encounter (Signed)
S/w patient's wife. She verbalized understanding to stop carvedilol and to start bystolic 10 mg by mouth once a day. Will call us with update if symptoms do not improve. Rx sent to pharmacy.

## 2017-08-12 DEATH — deceased

## 2017-08-27 ENCOUNTER — Ambulatory Visit: Payer: Managed Care, Other (non HMO) | Admitting: Cardiovascular Disease

## 2017-09-11 DEATH — deceased

## 2018-12-05 IMAGING — CT CT HEAD W/O CM
3 series · 14 of 47 positions shown, 16 images · non-contrast
Comparison: None.

CLINICAL DATA: 55 y/o M; dizziness with history of brain cancer
post chemotherapy and radiation treatment.

EXAM:
CT HEAD WITHOUT CONTRAST
TECHNIQUE: Contiguous axial images were obtained from the base of the skull
through the vertex without intravenous contrast.

[Series 2: head wo · axial · 0.47mm/px · z∈[-217,-82]mm · 8 of 33 slices shown, 10 images]
[im 3/33  brain]
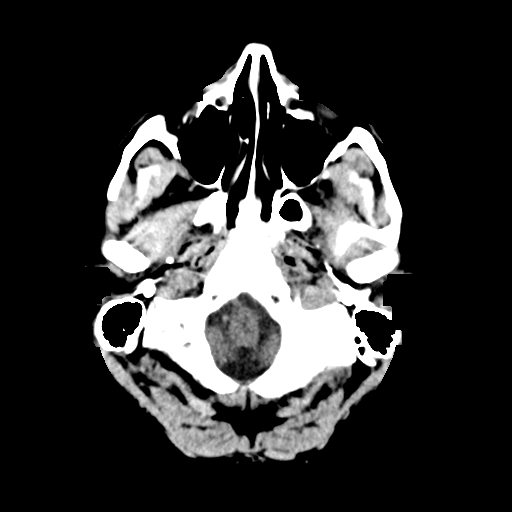
[im 3/33  bone]
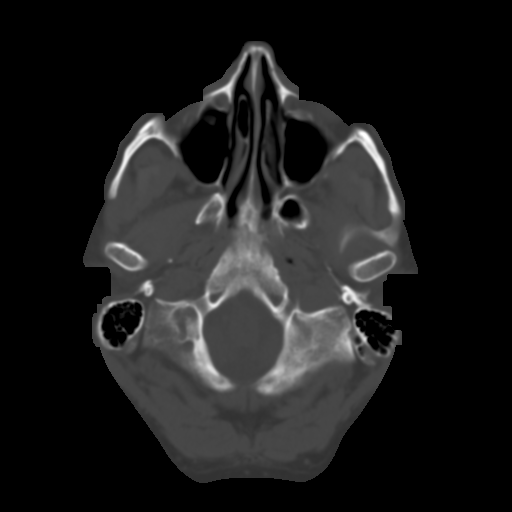
[im 7/33  brain]
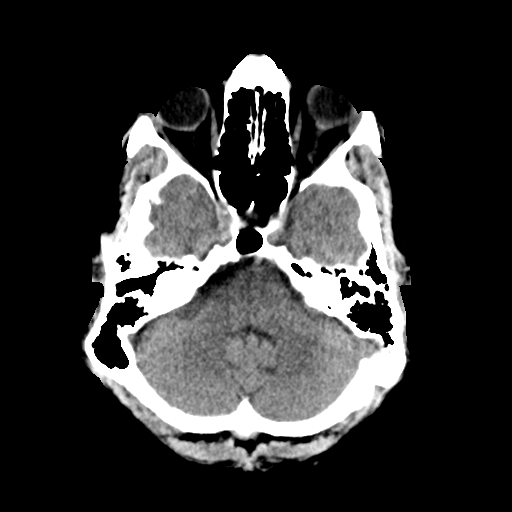
[im 10/33  brain]
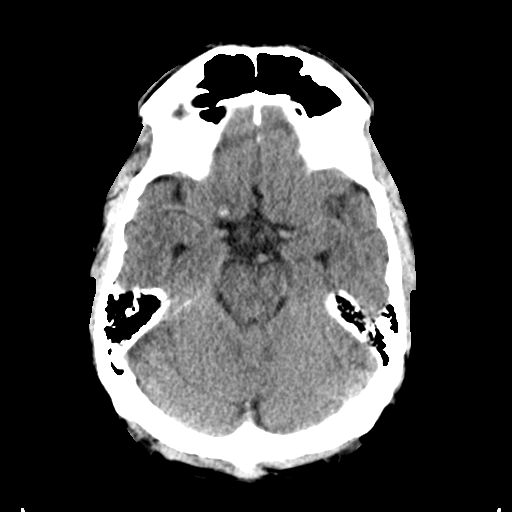
[im 15/33  brain]
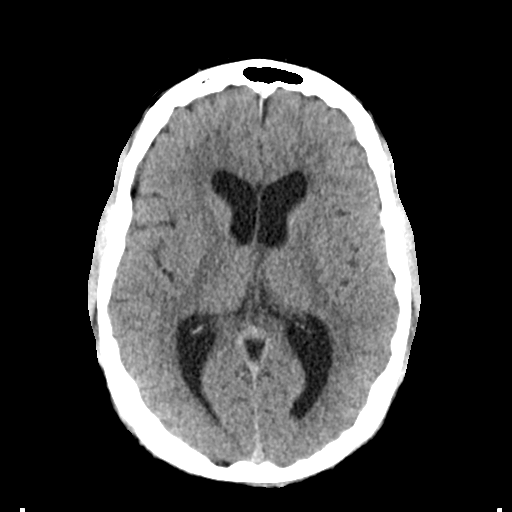
[im 18/33  brain]
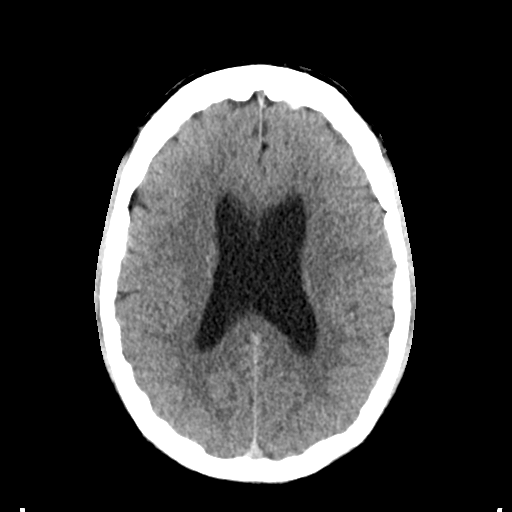
[im 18/33  bone]
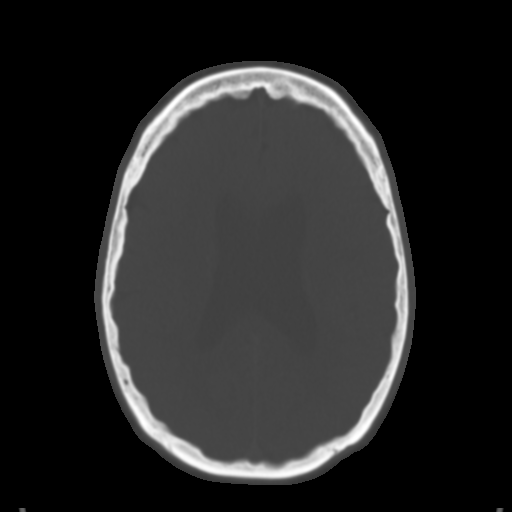
[im 23/33  brain]
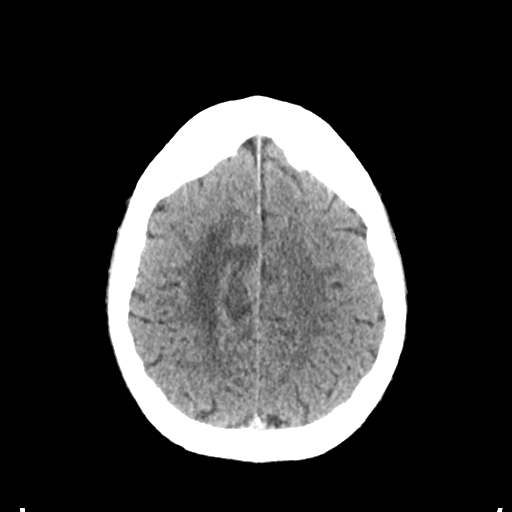
[im 26/33  brain]
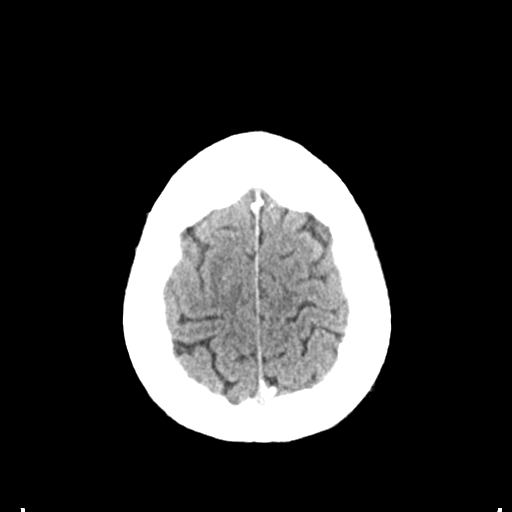
[im 30/33  brain]
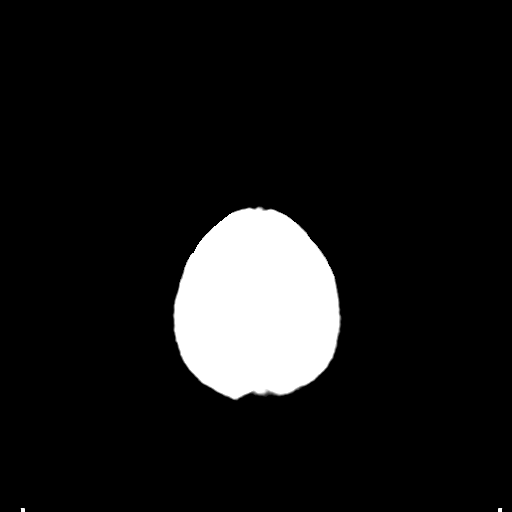

[Series 4: coronal soft tissue · coronal · 0.32mm/px · 3 of 70 slices shown]
[im 24/70  brain]
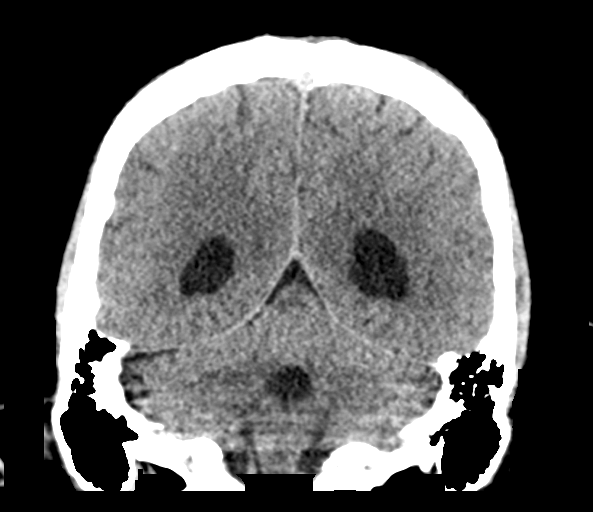
[im 31/70  brain]
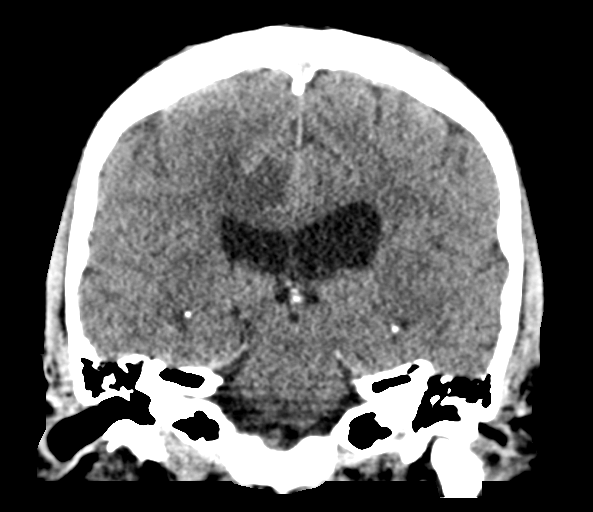
[im 39/70  brain]
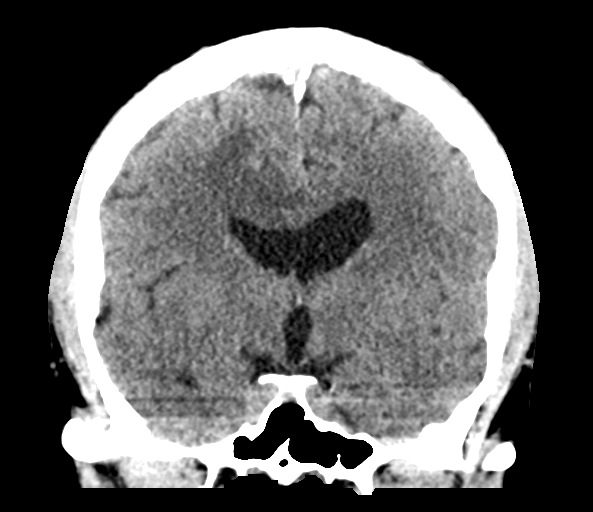

[Series 5: sagittal soft tissue · sagittal · 0.32mm/px · 3 of 56 slices shown]
[im 19/56  brain]
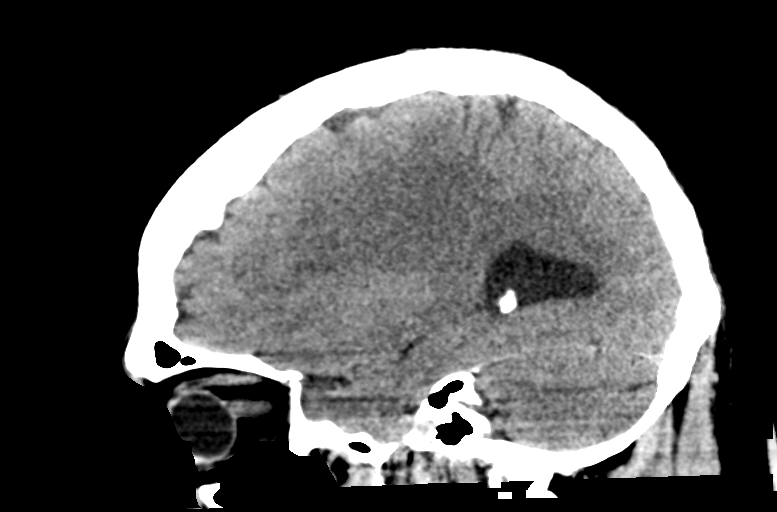
[im 28/56  brain]
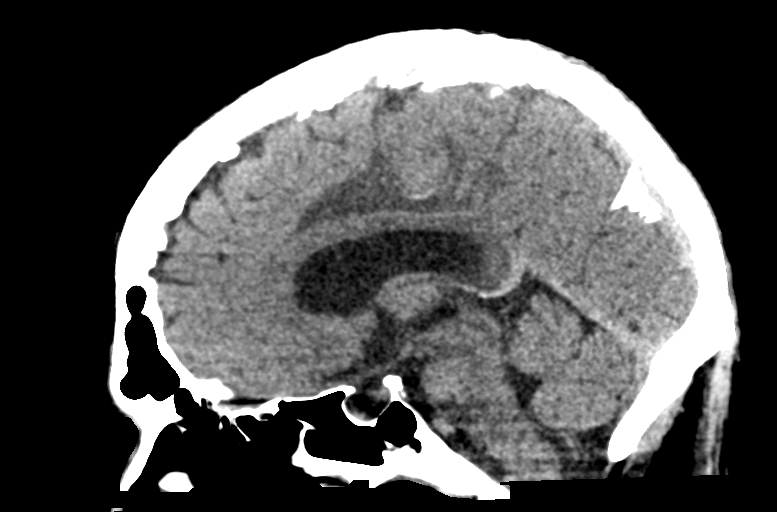
[im 37/56  brain]
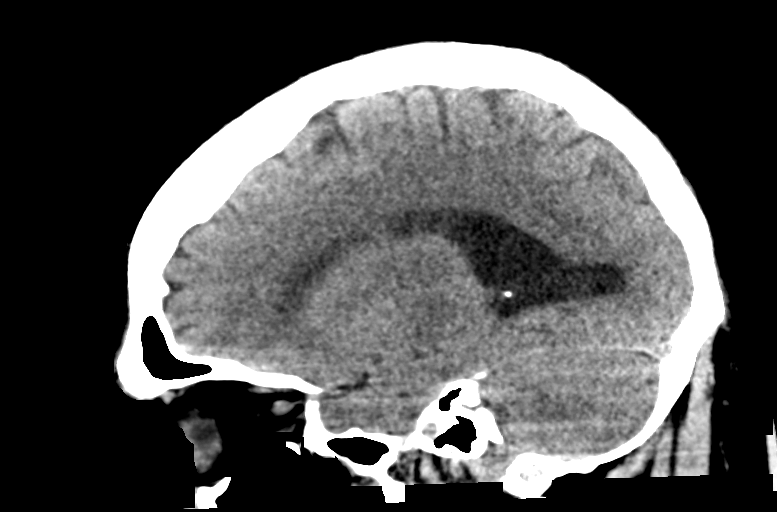

[14 of 47 positions shown; findings below may reference images not displayed]

FINDINGS: Brain: Right paramedian posterior frontal lobe mass measuring
approximately 28 x 18 x 23 mm (AP x ML x CC series 2, image 23 and
series 4, image 35). Surrounding the mass there is hypoattenuation
white matter within the right paramedian posterior frontal lobe
extending into the cingulate gyrus, right aspect of corpus callosum,
and right parietal lobe.

Otherwise there is no evidence for large territory infarct, focal
mass effect, or acute intracranial hemorrhage. No hydrocephalus or
herniation.

Vascular: No hyperdense vessel or unexpected calcification.

Skull: Normal. Negative for fracture or focal lesion.

Sinuses/Orbits: No acute finding.

Other: None.
IMPRESSION: 1. Right paramedian posterior frontal lobe mass measuring up to 28
mm with surrounding region of hypoattenuation and local mass effect
that may represent associated vasogenic edema or infiltrating
neoplasm. The mass abuts the falx and it is uncertain whether is
intra-axial or extra-axial. This can be further characterized with
MRI of the brain with and without intravenous contrast.
2. Otherwise no evidence for large territory infarct, focal mass
effect, or acute intracranial hemorrhage.

By: Shemario Reynalds M.D.
